# Patient Record
Sex: Male | Born: 1995 | Race: White | Hispanic: No | Marital: Single | Smoking: Never smoker
Health system: Southern US, Community
[De-identification: ages and names within clinical notes are randomized; demographics above are authoritative.]

## PROBLEM LIST (undated history)

## (undated) DIAGNOSIS — T7840XA Allergy, unspecified, initial encounter: Secondary | ICD-10-CM

## (undated) HISTORY — DX: Allergy, unspecified, initial encounter: T78.40XA

## (undated) HISTORY — PX: WISDOM TOOTH EXTRACTION: SHX21

---
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
## 2017-04-26 ENCOUNTER — Ambulatory Visit (INDEPENDENT_AMBULATORY_CARE_PROVIDER_SITE_OTHER): Payer: Managed Care, Other (non HMO)

## 2017-04-26 ENCOUNTER — Ambulatory Visit
Admission: EM | Admit: 2017-04-26 | Discharge: 2017-04-26 | Disposition: A | Discharge status: Home / Self Care | Payer: Managed Care, Other (non HMO) | Attending: Family Medicine | Admitting: Family Medicine

## 2017-04-26 DIAGNOSIS — S93402A Sprain of unspecified ligament of left ankle, initial encounter: Secondary | ICD-10-CM

## 2017-04-26 NOTE — ED Provider Notes (Signed)
[facility] ____________________________________________  Time seen: Approximately 11:24 AM  I have reviewed the triage vital signs and the nursing notes.   HISTORY  Chief Complaint Ankle Pain (left)   HPI Jeremy Escobar is a 21 y.o. male presenting for evaluation of left lateral ankle pain. Patient reports that yesterday afternoon he was playing disc golf and accidentally injured left ankle when stepping down from the platform, causing him to roll his left ankle and fall. Denies any other pain or injuries. Reports he has had continued left ankle pain since fall. States yesterday he was unable to tolerate weightbearing, but able to tolerate some weight today. Reports did take over-the-counter naproxen and apply ice which helps some. Denies pain radiation, paresthesias or other injury. Denies previous injury to left ankle. Reports no head injury or loss of consciousness. Reports otherwise feels well. States mild pain to left ankle at this time, and states worse with palpation or active ambulation.Denies recent sickness. Denies recent antibiotic use.    History reviewed. No pertinent past medical history.There are no active problems to display for this patient. denies   History reviewed. No pertinent surgical history.   No current facility-administered medications for this encounter.  No current outpatient prescriptions on file.  Allergies Patient has no known allergies.  History reviewed. No pertinent family history.  Social History Social History  Substance Use Topics  . Smoking status: Never Smoker  . Smokeless tobacco: Never Used  . Alcohol use No    Review of Systems Constitutional: No fever/chills Cardiovascular: Denies chest pain. Respiratory: Denies shortness of breath. Gastrointestinal: No abdominal pain.   Genitourinary: Negative for dysuria. Musculoskeletal: Negative for back pain. As above. Skin: Negative for  rash.   ____________________________________________   PHYSICAL EXAM:  VITAL SIGNS: ED Triage Vitals [04/26/17 1045]  Enc Vitals Group     BP (!) 119/59     Pulse Rate 72     Resp 18     Temp 98.3 F (36.8 C)     Temp Source Oral     SpO2 100 %     Weight 161 lb (73 kg)     Height 6\' 4"  (1.93 m)     Head Circumference      Peak Flow      Pain Score 4     Pain Loc      Pain Edu?      Excl. in GC?     Constitutional: Alert and oriented. Well appearing and in no acute distress. ENT      Head: Normocephalic and atraumatic. Cardiovascular: Normal rate, regular rhythm. Grossly normal heart sounds.  Good peripheral circulation. Respiratory: Normal respiratory effort without tachypnea nor retractions. Breath sounds are clear and equal bilaterally. No wheezes, rales, rhonchi. Musculoskeletal:No midline cervical, thoracic or lumbar tenderness to palpation. Bilateral pedal pulses equal and easily palpated. Except: Left lateral malleolus mild to moderate tenderness to direct palpation with mild tenderness immediately surrounding left lateral malleolus, minimal swelling, no ecchymosis, no pain with plantar flexion or dorsiflexion, pain present with ankle rotation and eversion and inversion, normal distal sensation in Her refill, left lower extremity otherwise nontender. Neurologic:  Normal speech and language Speech is normal. No gait instability.  Skin:  Skin is warm, dry Psychiatric: Mood and affect are normal. Speech and behavior are normal. Patient exhibits appropriate insight and judgment   ___________________________________________   LABS (all labs ordered are listed, but only abnormal results are displayed)  Labs Reviewed - No data to display  RADIOLOGY  Dg Ankle Complete Left  Result Date: 04/26/2017 CLINICAL DATA:  Injury. EXAM: LEFT ANKLE COMPLETE - 3+ VIEW COMPARISON:  No recent prior. FINDINGS: No acute bony or joint abnormality identified. No evidence of fracture  dislocation. IMPRESSION: No acute abnormality. Electronically Signed   By: Maisie Fus  Register   On: 04/26/2017 11:18   ____________________________________________   PROCEDURES Procedures    INITIAL IMPRESSION / ASSESSMENT AND PLAN / ED COURSE  Pertinent labs & imaging results that were available during my care of the patient were reviewed by me and considered in my medical decision making (see chart for details).  Well-appearing patient. No acute distress. Left ankle pain post mechanical injury that occurred yesterday afternoon. Left ankle x-ray per radiologist no acute abnormality. Suspect sprain injury. Velcro stirrup splint applied for support, by RN. Patient states he has crutches at home. Encouraged splint and crutch use for the next 3 days and gradually application of weight as tolerated. Encourage rest, ice, elevation and over-the-counter NSAIDs. Follow-up with orthopedic as needed for continued pain, information given.   Discussed follow up with Primary care physician this week. Discussed follow up and return parameters including no resolution or any worsening concerns. Patient verbalized understanding and agreed to plan.   ____________________________________________   FINAL CLINICAL IMPRESSION(S) / ED DIAGNOSES  Final diagnoses:  Sprain of left ankle, unspecified ligament, initial encounter     There are no discharge medications for this patient.   Note: This dictation was prepared with Dragon dictation along with smaller phrase technology. Any transcriptional errors that result from this process are unintentional.         Renford Dills, NP 04/26/17 2238

## 2017-04-26 NOTE — ED Triage Notes (Signed)
Pt was playing disc golf yesterday and he slipped on some rocks and twisted his ankle and felt a pop, he wasn't able to walk on it yesterday however today he can put some pressure.

## 2017-04-26 NOTE — Discharge Instructions (Signed)
Rest. Drink plenty of fluids. Ice and elevate. Use splint and crutches for 3 days, and continue as needed. Gradually apply weight as tolerated.   Follow up with orthopedic as needed for continued pain.   Follow up with your primary care physician this week as needed. Return to Urgent care for new or worsening concerns.

## 2017-06-18 ENCOUNTER — Encounter: Payer: Self-pay | Admitting: Family Medicine

## 2017-06-18 ENCOUNTER — Ambulatory Visit (INDEPENDENT_AMBULATORY_CARE_PROVIDER_SITE_OTHER): Payer: Managed Care, Other (non HMO) | Admitting: Family Medicine

## 2017-06-18 VITALS — BP 106/64 | HR 55 | Temp 97.7°F | Ht 73.0 in | Wt 158.2 lb

## 2017-06-18 DIAGNOSIS — Z0000 Encounter for general adult medical examination without abnormal findings: Secondary | ICD-10-CM

## 2017-06-18 DIAGNOSIS — Z23 Encounter for immunization: Secondary | ICD-10-CM | POA: Diagnosis not present
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
## 2018-03-02 IMAGING — CR DG ANKLE COMPLETE 3+V*L*
3 series · 4 of 4 positions shown · non-contrast
Comparison: No recent prior.

CLINICAL DATA: Injury.

EXAM:
LEFT ANKLE COMPLETE - 3+ VIEW

[ankle ap]
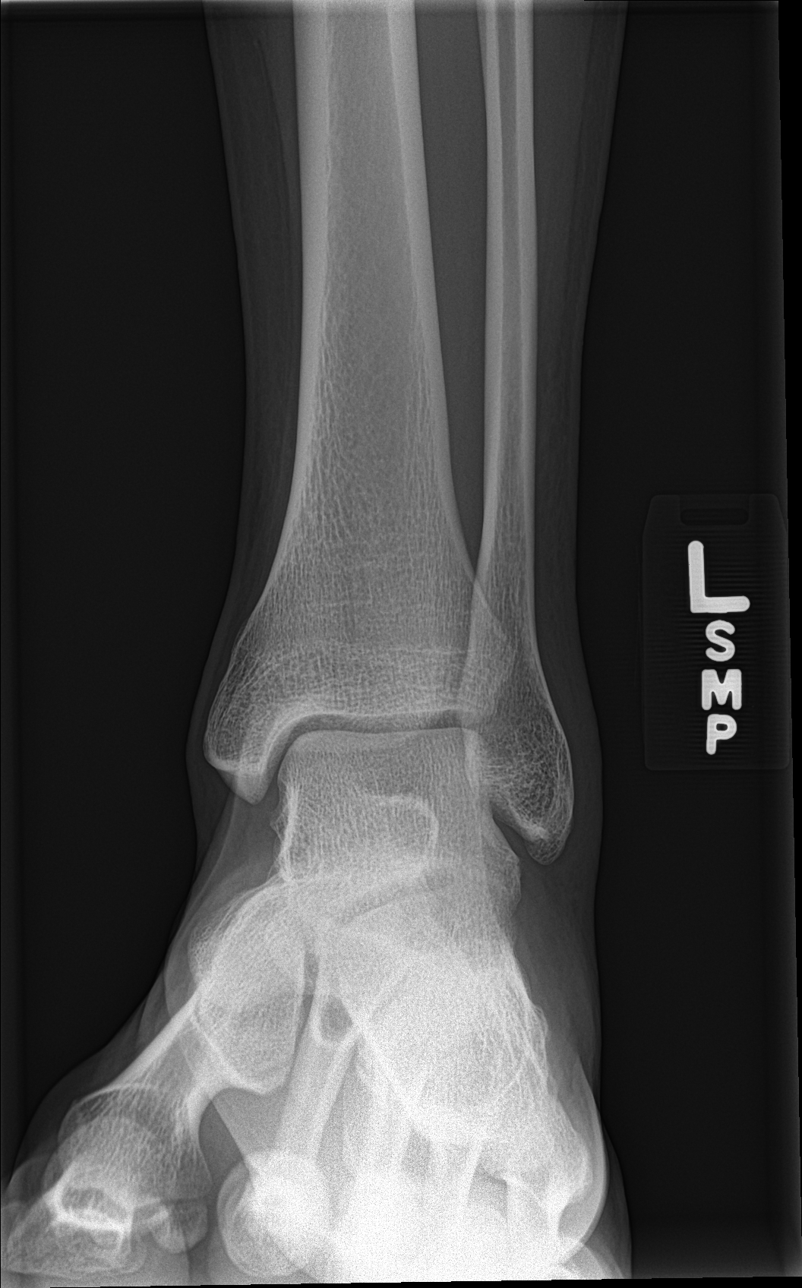

[ankle obl]
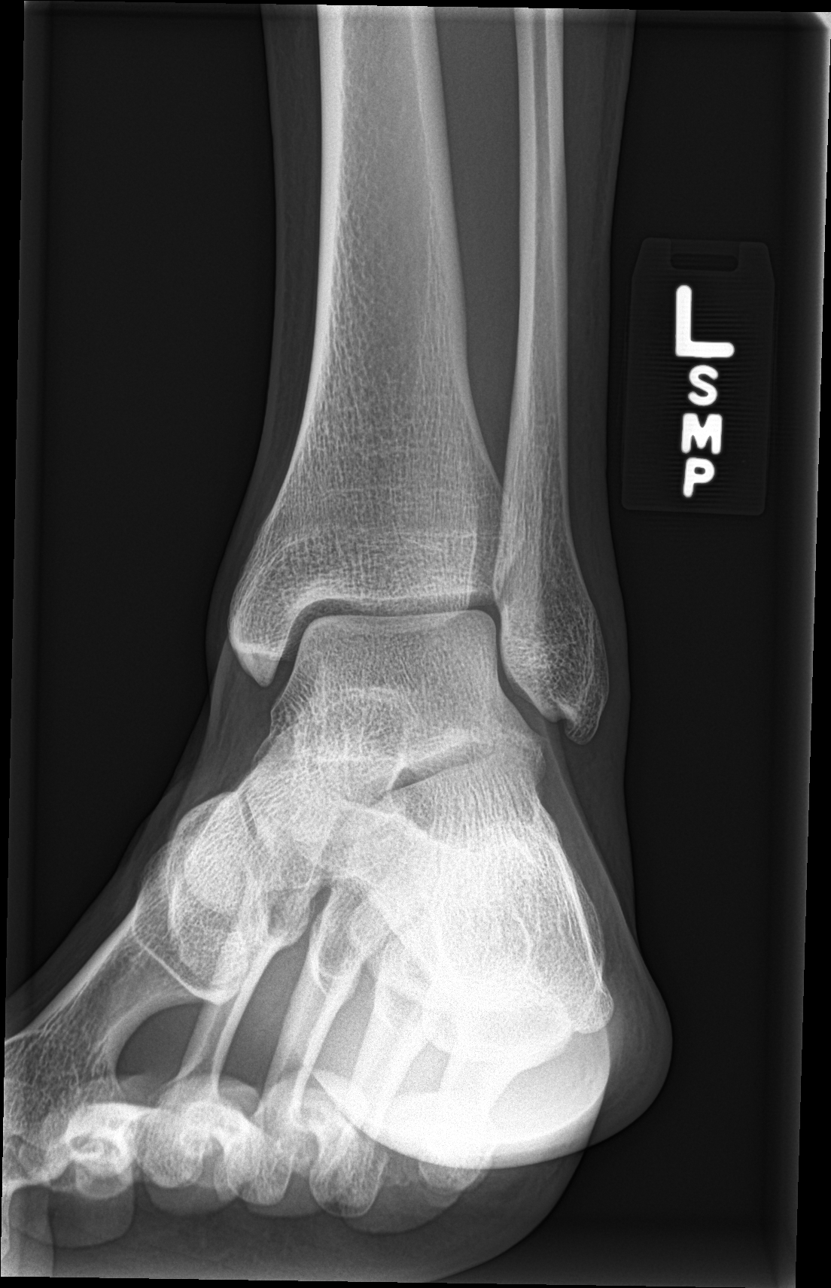

[Series 3: ankle lat · 0.14mm/px · 2 of 2 slices shown]
[im 1/2]
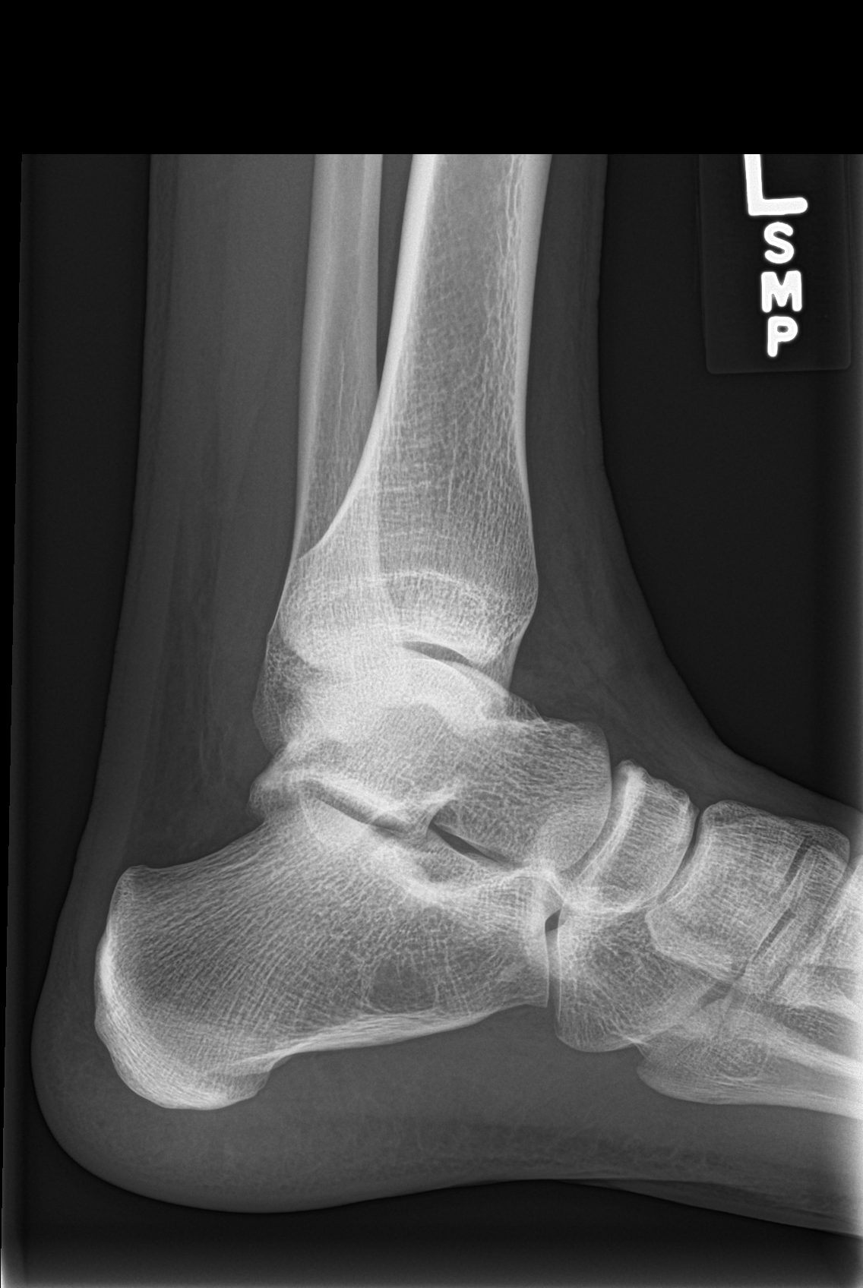
[im 2/2]
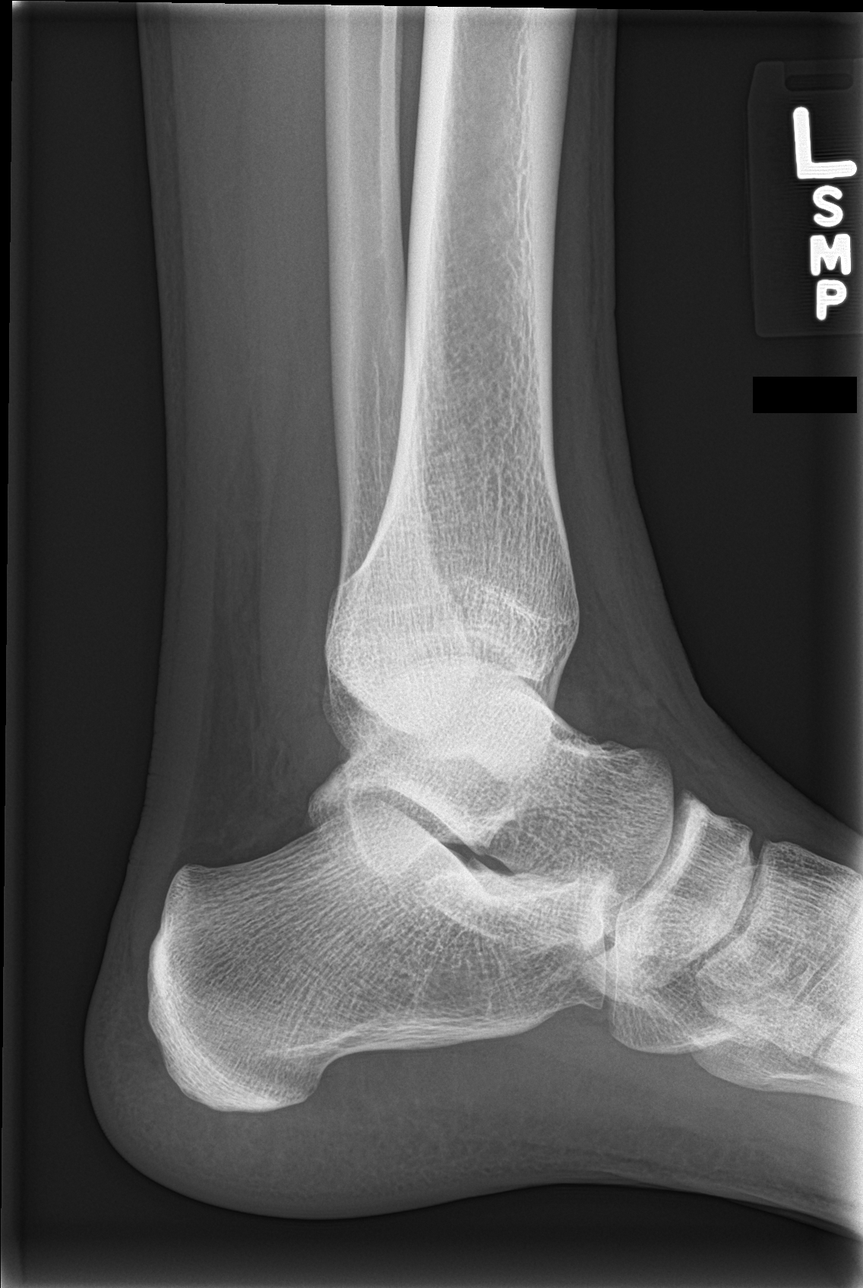

[4 of 4 positions shown; findings below may reference images not displayed]

FINDINGS: No acute bony or joint abnormality identified. No evidence of
fracture dislocation.
IMPRESSION: No acute abnormality.

## 2021-03-26 ENCOUNTER — Telehealth: Payer: Self-pay

## 2021-03-26 ENCOUNTER — Encounter: Payer: Self-pay | Admitting: Family Medicine

## 2021-03-26 ENCOUNTER — Telehealth (INDEPENDENT_AMBULATORY_CARE_PROVIDER_SITE_OTHER): Payer: BC Managed Care – PPO | Admitting: Family Medicine

## 2021-03-26 DIAGNOSIS — B349 Viral infection, unspecified: Secondary | ICD-10-CM

## 2021-03-26 NOTE — Progress Notes (Signed)
Virtual telephone visit    Virtual Visit via Telephone Note   This visit type was conducted due to national recommendations for restrictions regarding the COVID-19 Pandemic (e.g. social distancing) in an effort to limit this patient's exposure and mitigate transmission in our community. Due to his co-morbid illnesses, this patient is at least at moderate risk for complications without adequate follow up. This format is felt to be most appropriate for this patient at this time. The patient did not have access to video technology or had technical difficulties with video requiring transitioning to audio format only (telephone). Physical exam was limited to content and character of the telephone converstion.    Patient location: Home no family members present Provider location: Work office  I discussed the limitations of evaluation and management by telemedicine and the availability of in person appointments. The patient expressed understanding and agreed to proceed.   Visit Date: 03/26/2021  Today's healthcare provider: Azalee Course Just, FNP   Chief Complaint  Patient presents with  . URI   Subjective    URI  This is a new problem. The problem has been gradually improving. There has been no fever. Associated symptoms include congestion, coughing and a sore throat. Pertinent negatives include no ear pain, headaches, rhinorrhea, sinus pain, sneezing or wheezing. He has tried acetaminophen and decongestant for the symptoms. The treatment provided moderate relief.    Late Thursday symptoms started Had a fever and a cough Took 2 home covid tests and both were negative Monday night felt light headed Is feeling much better now Needs a work note for the three days he missed    There are no problems to display for this patient.  Past Medical History:  Diagnosis Date  . Allergy    Social History   Tobacco Use  . Smoking status: Never Smoker  . Smokeless tobacco: Never Used   Substance Use Topics  . Alcohol use: No  . Drug use: No   No Known Allergies  Medications: No outpatient medications prior to visit.   No facility-administered medications prior to visit.    Review of Systems  Constitutional: Positive for fatigue and fever (Highest temp 101.  Pt reports this has resolved. ). Negative for activity change, appetite change, chills, diaphoresis and unexpected weight change.  HENT: Positive for congestion, postnasal drip and sore throat. Negative for ear discharge, ear pain, rhinorrhea, sinus pressure, sinus pain, sneezing and tinnitus.   Eyes: Negative.   Respiratory: Positive for cough. Negative for apnea, choking, chest tightness, shortness of breath, wheezing and stridor.   Neurological: Positive for light-headedness. Negative for dizziness and headaches.      Objective    There were no vitals taken for this visit.  Constitutional:      General: He is not in acute distress.  Pulmonary:     Effort: Pulmonary effort is normal. No respiratory distress.  Neurological:     Mental Status: He is alert and oriented to person, place, and time.  Psychiatric:        Mood and Affect: Mood normal.        Behavior: Behavior normal.     Assessment & Plan     Problem List Items Addressed This Visit   None   Visit Diagnoses    Viral illness    -  Primary     Plan Work note provided Continue to treat symptoms with conservative OTC treatments RTC/ED precautions provided Increase fluids as able Declined further testing at  this time  Return if symptoms worsen or fail to improve.    I discussed the assessment and treatment plan with the patient. The patient was provided an opportunity to ask questions and all were answered. The patient agreed with the plan and demonstrated an understanding of the instructions.   The patient was advised to call back or seek an in-person evaluation if the symptoms worsen or if the condition fails to improve as  anticipated.  I provided 20 minutes of non-face-to-face time during this encounter.   Azalee Course Just, FNP [facility] [phone] (phone) [phone] (fax)  [facility]

## 2021-03-26 NOTE — Telephone Encounter (Signed)
Copied from CRM [id number]. Topic: General - Other >> Mar 26, 2021 11:41 AM Marylen Ponto wrote: Reason for CRM: Pt stated he is not able to get in to his MYCHART account and he needs the doctor's note. Pt requests that the doctor's note be e-mailed to him or put up front for pick up. Pt requests call back. Cb# [phone]

## 2021-03-26 NOTE — Telephone Encounter (Signed)
Assisted patient will accessing My Chart, demographic information needed to updated. Patient received Dr. Phoebe Sharps and has no other concerns.
